# Patient Record
Sex: Female | Born: 1990 | Hispanic: No | Marital: Married
Health system: Southern US, Community
[De-identification: ages and names within clinical notes are randomized; demographics above are authoritative.]

---

## 2015-08-02 DIAGNOSIS — R196 Halitosis: Secondary | ICD-10-CM | POA: Diagnosis not present

## 2015-08-02 DIAGNOSIS — R42 Dizziness and giddiness: Secondary | ICD-10-CM | POA: Diagnosis not present

## 2015-08-02 DIAGNOSIS — R300 Dysuria: Secondary | ICD-10-CM | POA: Diagnosis not present

## 2015-08-02 DIAGNOSIS — R350 Frequency of micturition: Secondary | ICD-10-CM | POA: Diagnosis not present

## 2015-09-28 DIAGNOSIS — R300 Dysuria: Secondary | ICD-10-CM | POA: Diagnosis not present

## 2015-09-28 DIAGNOSIS — Z0000 Encounter for general adult medical examination without abnormal findings: Secondary | ICD-10-CM | POA: Diagnosis not present

## 2016-01-15 DIAGNOSIS — I951 Orthostatic hypotension: Secondary | ICD-10-CM | POA: Diagnosis not present

## 2016-03-15 DIAGNOSIS — R196 Halitosis: Secondary | ICD-10-CM | POA: Diagnosis not present

## 2016-03-15 DIAGNOSIS — B379 Candidiasis, unspecified: Secondary | ICD-10-CM | POA: Diagnosis not present

## 2016-03-15 DIAGNOSIS — R3989 Other symptoms and signs involving the genitourinary system: Secondary | ICD-10-CM | POA: Diagnosis not present

## 2016-10-16 DIAGNOSIS — Z0000 Encounter for general adult medical examination without abnormal findings: Secondary | ICD-10-CM | POA: Diagnosis not present

## 2016-10-16 DIAGNOSIS — Z13220 Encounter for screening for lipoid disorders: Secondary | ICD-10-CM | POA: Diagnosis not present

## 2016-10-16 DIAGNOSIS — I959 Hypotension, unspecified: Secondary | ICD-10-CM | POA: Diagnosis not present

## 2016-10-16 DIAGNOSIS — R196 Halitosis: Secondary | ICD-10-CM | POA: Diagnosis not present

## 2016-10-16 DIAGNOSIS — H9201 Otalgia, right ear: Secondary | ICD-10-CM | POA: Diagnosis not present

## 2016-10-16 DIAGNOSIS — R300 Dysuria: Secondary | ICD-10-CM | POA: Diagnosis not present

## 2016-10-30 DIAGNOSIS — R196 Halitosis: Secondary | ICD-10-CM | POA: Diagnosis not present

## 2016-11-14 DIAGNOSIS — K080 Exfoliation of teeth due to systemic causes: Secondary | ICD-10-CM | POA: Diagnosis not present

## 2016-11-28 DIAGNOSIS — R196 Halitosis: Secondary | ICD-10-CM | POA: Diagnosis not present

## 2017-02-26 DIAGNOSIS — N762 Acute vulvitis: Secondary | ICD-10-CM | POA: Diagnosis not present

## 2017-02-26 DIAGNOSIS — Z681 Body mass index (BMI) 19 or less, adult: Secondary | ICD-10-CM | POA: Diagnosis not present

## 2017-03-19 DIAGNOSIS — B379 Candidiasis, unspecified: Secondary | ICD-10-CM | POA: Diagnosis not present

## 2017-03-19 DIAGNOSIS — R350 Frequency of micturition: Secondary | ICD-10-CM | POA: Diagnosis not present

## 2017-03-19 DIAGNOSIS — M791 Myalgia, unspecified site: Secondary | ICD-10-CM | POA: Diagnosis not present

## 2017-03-19 DIAGNOSIS — M25519 Pain in unspecified shoulder: Secondary | ICD-10-CM | POA: Diagnosis not present

## 2017-04-03 DIAGNOSIS — M79609 Pain in unspecified limb: Secondary | ICD-10-CM | POA: Diagnosis not present

## 2017-07-21 DIAGNOSIS — K080 Exfoliation of teeth due to systemic causes: Secondary | ICD-10-CM | POA: Diagnosis not present

## 2017-08-18 DIAGNOSIS — H6980 Other specified disorders of Eustachian tube, unspecified ear: Secondary | ICD-10-CM | POA: Diagnosis not present

## 2017-08-18 DIAGNOSIS — Z1159 Encounter for screening for other viral diseases: Secondary | ICD-10-CM | POA: Diagnosis not present

## 2017-08-18 DIAGNOSIS — Z975 Presence of (intrauterine) contraceptive device: Secondary | ICD-10-CM | POA: Diagnosis not present

## 2017-08-21 DIAGNOSIS — H6993 Unspecified Eustachian tube disorder, bilateral: Secondary | ICD-10-CM | POA: Diagnosis not present

## 2017-09-25 DIAGNOSIS — K080 Exfoliation of teeth due to systemic causes: Secondary | ICD-10-CM | POA: Diagnosis not present

## 2017-10-13 DIAGNOSIS — B349 Viral infection, unspecified: Secondary | ICD-10-CM | POA: Diagnosis not present

## 2017-11-21 DIAGNOSIS — Z13220 Encounter for screening for lipoid disorders: Secondary | ICD-10-CM | POA: Diagnosis not present

## 2017-11-21 DIAGNOSIS — G479 Sleep disorder, unspecified: Secondary | ICD-10-CM | POA: Diagnosis not present

## 2017-11-21 DIAGNOSIS — Z0000 Encounter for general adult medical examination without abnormal findings: Secondary | ICD-10-CM | POA: Diagnosis not present

## 2017-11-21 DIAGNOSIS — R05 Cough: Secondary | ICD-10-CM | POA: Diagnosis not present

## 2017-11-21 DIAGNOSIS — I959 Hypotension, unspecified: Secondary | ICD-10-CM | POA: Diagnosis not present

## 2017-11-21 DIAGNOSIS — R196 Halitosis: Secondary | ICD-10-CM | POA: Diagnosis not present

## 2018-01-01 DIAGNOSIS — Z01419 Encounter for gynecological examination (general) (routine) without abnormal findings: Secondary | ICD-10-CM | POA: Diagnosis not present

## 2018-01-01 DIAGNOSIS — Z681 Body mass index (BMI) 19 or less, adult: Secondary | ICD-10-CM | POA: Diagnosis not present

## 2018-01-01 DIAGNOSIS — Z124 Encounter for screening for malignant neoplasm of cervix: Secondary | ICD-10-CM | POA: Diagnosis not present

## 2018-02-09 ENCOUNTER — Ambulatory Visit (INDEPENDENT_AMBULATORY_CARE_PROVIDER_SITE_OTHER): Payer: Federal, State, Local not specified - PPO | Admitting: Mental Health

## 2018-02-09 DIAGNOSIS — F4323 Adjustment disorder with mixed anxiety and depressed mood: Secondary | ICD-10-CM
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
## 2018-02-11 DIAGNOSIS — K080 Exfoliation of teeth due to systemic causes: Secondary | ICD-10-CM | POA: Diagnosis not present

## 2018-03-02 ENCOUNTER — Ambulatory Visit: Payer: Federal, State, Local not specified - PPO | Admitting: Mental Health

## 2018-03-02 DIAGNOSIS — F4323 Adjustment disorder with mixed anxiety and depressed mood: Secondary | ICD-10-CM
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
## 2018-03-09 DIAGNOSIS — G4700 Insomnia, unspecified: Secondary | ICD-10-CM | POA: Diagnosis not present

## 2018-03-09 DIAGNOSIS — B379 Candidiasis, unspecified: Secondary | ICD-10-CM | POA: Diagnosis not present

## 2018-03-09 DIAGNOSIS — M542 Cervicalgia: Secondary | ICD-10-CM | POA: Diagnosis not present

## 2018-03-09 DIAGNOSIS — H9319 Tinnitus, unspecified ear: Secondary | ICD-10-CM | POA: Diagnosis not present

## 2018-03-11 DIAGNOSIS — K080 Exfoliation of teeth due to systemic causes: Secondary | ICD-10-CM | POA: Diagnosis not present

## 2018-03-17 ENCOUNTER — Ambulatory Visit: Payer: Federal, State, Local not specified - PPO | Admitting: Mental Health

## 2018-03-31 ENCOUNTER — Ambulatory Visit: Payer: Federal, State, Local not specified - PPO | Admitting: Mental Health

## 2018-04-06 ENCOUNTER — Ambulatory Visit: Payer: Federal, State, Local not specified - PPO | Admitting: Mental Health

## 2018-04-06 DIAGNOSIS — F4323 Adjustment disorder with mixed anxiety and depressed mood: Secondary | ICD-10-CM

## 2018-04-06 NOTE — Progress Notes (Signed)
Psychotherapy Note  Name: April Tapia Date: 04/13/2018 MRN: [id number] DOB: 06/19/1990 PCP: Patient, No Pcp Per  Time spent:  54 minutes  Treatment:  Individual therapy  Mental Status Exam:   Appearance:   Casual     Behavior:  Appropriate  Motor:  Normal  Speech/Language:   Clear and Coherent  Affect:  Appropriate  Mood:  depressed  Thought process:  normal  Thought content:    WNL  Sensory/Perceptual disturbances:    WNL  Orientation:  oriented to person, place and time/date  Attention:  Good  Concentration:  Good  Memory:  WNL  Fund of knowledge:   Good  Insight:    Good  Judgment:   Good  Impulse Control:  Good   Reported Symptoms:  Sleep disturbance, appetite disturbance, depressed mood at times, anxiety at times  Risk Assessment: Danger to Self:  No Self-injurious Behavior: No Danger to Others: No Duty to Warn:no Physical Aggression / Violence:No  Access to Firearms a concern: No  Gang Involvement:No  Patient / guardian was educated about steps to take if suicide or homicide risk level increases between visits: yes While future psychiatric events cannot be accurately predicted, the patient does not currently require acute inpatient psychiatric care and does not currently meet [facility] involuntary commitment criteria.   Subjective: Patient presented for today's session on time, casually dressed in no distress.  Pt stated she has two jobs now getting about 4 hours sleep due to her work schedule. She stated she is not tired. We explored self-care, eating sufficiently, getting more rest and attending doctor appts as needed.  In further discussion, she identified drinking higher amounts of coffee to get through the shifts.  She also identified that she is working these excessive hours largely in part to keep her mind busy, to have less anxiety and depression.  We discussed other coping mechanisms such as diaphragmatic breathing mindfulness and cognitive-based  approaches.  Encouraged her to continue to self monitor negative self talk.   Interventions:  CBT, supportive therapy, strength based / solution focused approaches   Diagnoses:    ICD-10-CM   1. Adjustment disorder with mixed anxiety and depressed mood F43.23      1.  Patient to continue to engage in individual counseling 2-4 times a month or as needed. 2.  Patient to identify and apply CBT, coping skills learned in session to decrease depression and anxiety symptoms. 3.  Patient to contact this office, go to the local ED or call 911 if a crisis or emergency develops between visits.    Waldron Sessionhristopher Simon [place], [facility]

## 2018-04-20 ENCOUNTER — Ambulatory Visit: Payer: Federal, State, Local not specified - PPO | Admitting: Mental Health

## 2018-04-20 DIAGNOSIS — F4323 Adjustment disorder with mixed anxiety and depressed mood: Secondary | ICD-10-CM | POA: Diagnosis not present

## 2018-04-20 NOTE — Progress Notes (Signed)
Psychotherapy Note  Name: April Tapia Date: 04/20/2018 MRN: [id number] DOB: 1990-09-20 PCP: Patient, No Pcp Per  Time spent:  54 minutes  Treatment:  Individual therapy  Mental Status Exam:   Appearance:   Casual     Behavior:  Appropriate  Motor:  Normal  Speech/Language:   Clear and Coherent  Affect:  Appropriate  Mood:  depressed  Thought process:  normal  Thought content:    WNL  Sensory/Perceptual disturbances:    WNL  Orientation:  oriented to person, place and time/date  Attention:  Good  Concentration:  Good  Memory:  WNL  Fund of knowledge:   Good  Insight:    Good  Judgment:   Good  Impulse Control:  Good   Reported Symptoms:  Sleep disturbance, appetite disturbance, depressed mood at times, anxiety at times  Risk Assessment: Danger to Self:  No Self-injurious Behavior: No Danger to Others: No Duty to Warn:no Physical Aggression / Violence:No  Access to Firearms a concern: No  Gang Involvement:No  Patient / guardian was educated about steps to take if suicide or homicide risk level increases between visits: yes While future psychiatric events cannot be accurately predicted, the patient does not currently require acute inpatient psychiatric care and does not currently meet [facility] involuntary commitment criteria.   Subjective: Patient presented for today's session on time, casually dressed in no distress.  Patient shared recent experiences, namely her meeting with her husband at a restaurant to obtain her identification paperwork in which she needs for maintaining her status living in the [place].  She shared other stressors, some disappointing experiences with others at work at times.  She stated she has a tendency to trust others and is affected emotionally when others act negatively towards her, giving examples.  Ways to communicate and set boundaries as needed was discussed.  She stated that she continues to work 2 jobs but often has been tired,  finding sleep between the shifts.  We discussed the possibility in the coming weeks or months to shift her work hours if possible.  She stated she is not in need of extra money for the second job, however she is "fine" to work.  We reviewed the importance of self-care, getting rest and eating sufficiently. We reviewed other coping mechanisms such as diaphragmatic breathing mindfulness and cognitive-based approaches.  Encouraged her to continue to self monitor negative self talk.   Interventions:  CBT, supportive therapy, strength based / solution focused approaches   Diagnoses:    ICD-10-CM   1. Adjustment disorder with mixed anxiety and depressed mood F43.23      1.  Patient to continue to engage in individual counseling 2-4 times a month or as needed. 2.  Patient to identify and apply CBT, coping skills learned in session to decrease depression and anxiety symptoms. 3.  Patient to contact this office, go to the local ED or call 911 if a crisis or emergency develops between visits.    Waldron Session, [facility]

## 2018-05-01 DIAGNOSIS — Z30431 Encounter for routine checking of intrauterine contraceptive device: Secondary | ICD-10-CM | POA: Diagnosis not present

## 2018-05-01 DIAGNOSIS — B379 Candidiasis, unspecified: Secondary | ICD-10-CM | POA: Diagnosis not present

## 2018-05-01 DIAGNOSIS — I959 Hypotension, unspecified: Secondary | ICD-10-CM | POA: Diagnosis not present

## 2018-05-13 ENCOUNTER — Ambulatory Visit (INDEPENDENT_AMBULATORY_CARE_PROVIDER_SITE_OTHER): Payer: Federal, State, Local not specified - PPO | Admitting: Mental Health

## 2018-05-13 DIAGNOSIS — F4323 Adjustment disorder with mixed anxiety and depressed mood: Secondary | ICD-10-CM | POA: Diagnosis not present

## 2018-05-13 NOTE — Progress Notes (Signed)
Psychotherapy Note  Name: Janett BillowManpreet Matheny Date: 05/13/2018 MRN: [id number] DOB: 08-07-1990 PCP: Patient, No Pcp Per  Time spent:  54 minutes  Treatment:  Individual therapy  Mental Status Exam:   Appearance:   Casual     Behavior:  Appropriate  Motor:  Normal  Speech/Language:   Clear and Coherent  Affect:  Appropriate  Mood:  depressed  Thought process:  normal  Thought content:    WNL  Sensory/Perceptual disturbances:    WNL  Orientation:  oriented to person, place and time/date  Attention:  Good  Concentration:  Good  Memory:  WNL  Fund of knowledge:   Good  Insight:    Good  Judgment:   Good  Impulse Control:  Good   Reported Symptoms:  Sleep disturbance, appetite disturbance, depressed mood at times, anxiety at times  Risk Assessment: Danger to Self:  No Self-injurious Behavior: No Danger to Others: No Duty to Warn:no Physical Aggression / Violence:No  Access to Firearms a concern: No  Gang Involvement:No  Patient / guardian was educated about steps to take if suicide or homicide risk level increases between visits: yes While future psychiatric events cannot be accurately predicted, the patient does not currently require acute inpatient psychiatric care and does not currently meet [facility] involuntary commitment criteria.   Subjective: Patient presented for today's session on time, casually dressed in no distress.  Patient shared progress.  She shared how she continues to gain her independence, works full-time and has moved into her own apartment.  She was living in a multi person home renting a room for the last few months.  She shared how she has had some stressful work experiences with some fellow employees and how she has attempted to set boundaries as needed.  Patient presented as visibly tired, fatigued.  We reviewed her sleep schedule and getting adequate rest.  She stated that she plans to seek a new full-time job in the next month which will give her a  more consistent schedule and improve her sleep hygiene.  Encouraged her to follow through as obtaining enough sleep is a component of self-care.  Other ways to cope and care for herself were explored.  Provided support, encouragement continuing work with patient from a strength-based approach.  Encouraged her to contact this office between sessions if needed.   Interventions:  CBT, supportive therapy, strength based / solution focused approaches   Diagnoses:    ICD-10-CM   1. Adjustment disorder with mixed anxiety and depressed mood F43.23      1.  Patient to continue to engage in individual counseling 2-4 times a month or as needed. 2.  Patient to identify and apply CBT, coping skills learned in session to decrease depression and anxiety symptoms. 3.  Patient to contact this office, go to the local ED or call 911 if a crisis or emergency develops between visits.    Waldron Sessionhristopher Careem Yasui, [facility]

## 2018-06-01 DIAGNOSIS — S46811A Strain of other muscles, fascia and tendons at shoulder and upper arm level, right arm, initial encounter: Secondary | ICD-10-CM | POA: Diagnosis not present

## 2018-08-03 ENCOUNTER — Other Ambulatory Visit: Payer: Self-pay | Admitting: Family Medicine

## 2018-08-03 ENCOUNTER — Other Ambulatory Visit: Payer: Self-pay

## 2018-08-03 ENCOUNTER — Ambulatory Visit
Admission: RE | Admit: 2018-08-03 | Discharge: 2018-08-03 | Disposition: A | Discharge status: Home / Self Care | Payer: Federal, State, Local not specified - PPO | Source: Ambulatory Visit | Attending: Family Medicine | Admitting: Family Medicine

## 2018-08-03 DIAGNOSIS — M79671 Pain in right foot: Secondary | ICD-10-CM | POA: Diagnosis not present

## 2018-08-03 DIAGNOSIS — M7989 Other specified soft tissue disorders: Secondary | ICD-10-CM

## 2018-08-03 DIAGNOSIS — I959 Hypotension, unspecified: Secondary | ICD-10-CM | POA: Diagnosis not present

## 2018-08-03 DIAGNOSIS — M25571 Pain in right ankle and joints of right foot: Secondary | ICD-10-CM | POA: Diagnosis not present

## 2020-07-13 IMAGING — CR RIGHT FOOT COMPLETE - 3+ VIEW
3 series · 3 of 3 positions shown · non-contrast
Comparison: None.

CLINICAL DATA: Four days right foot pain over navicular region on
the dorsal surface. No injury.

EXAM:
RIGHT FOOT COMPLETE - 3+ VIEW

[x foot ap right]
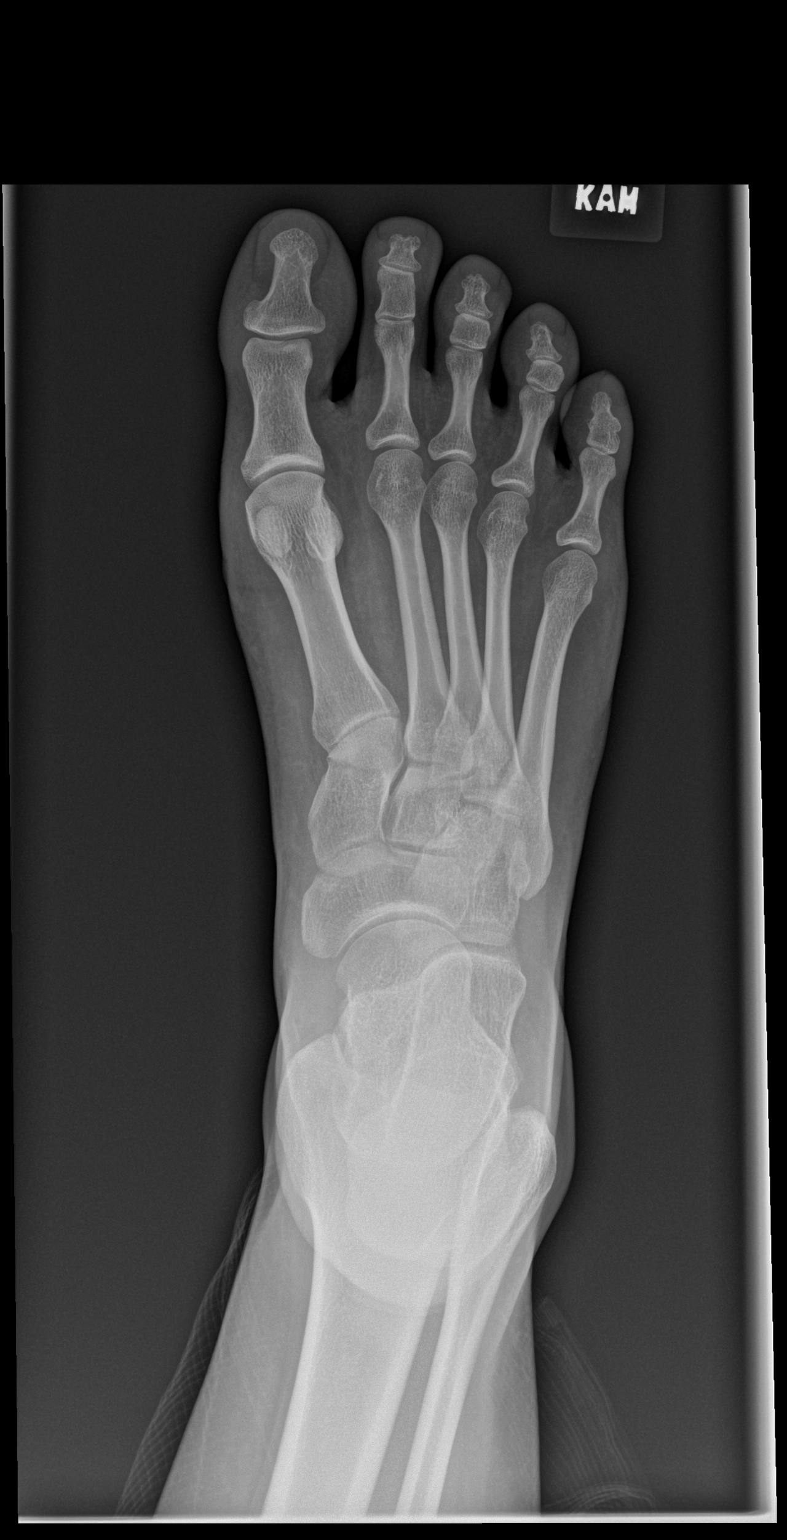

[x foot obl right]
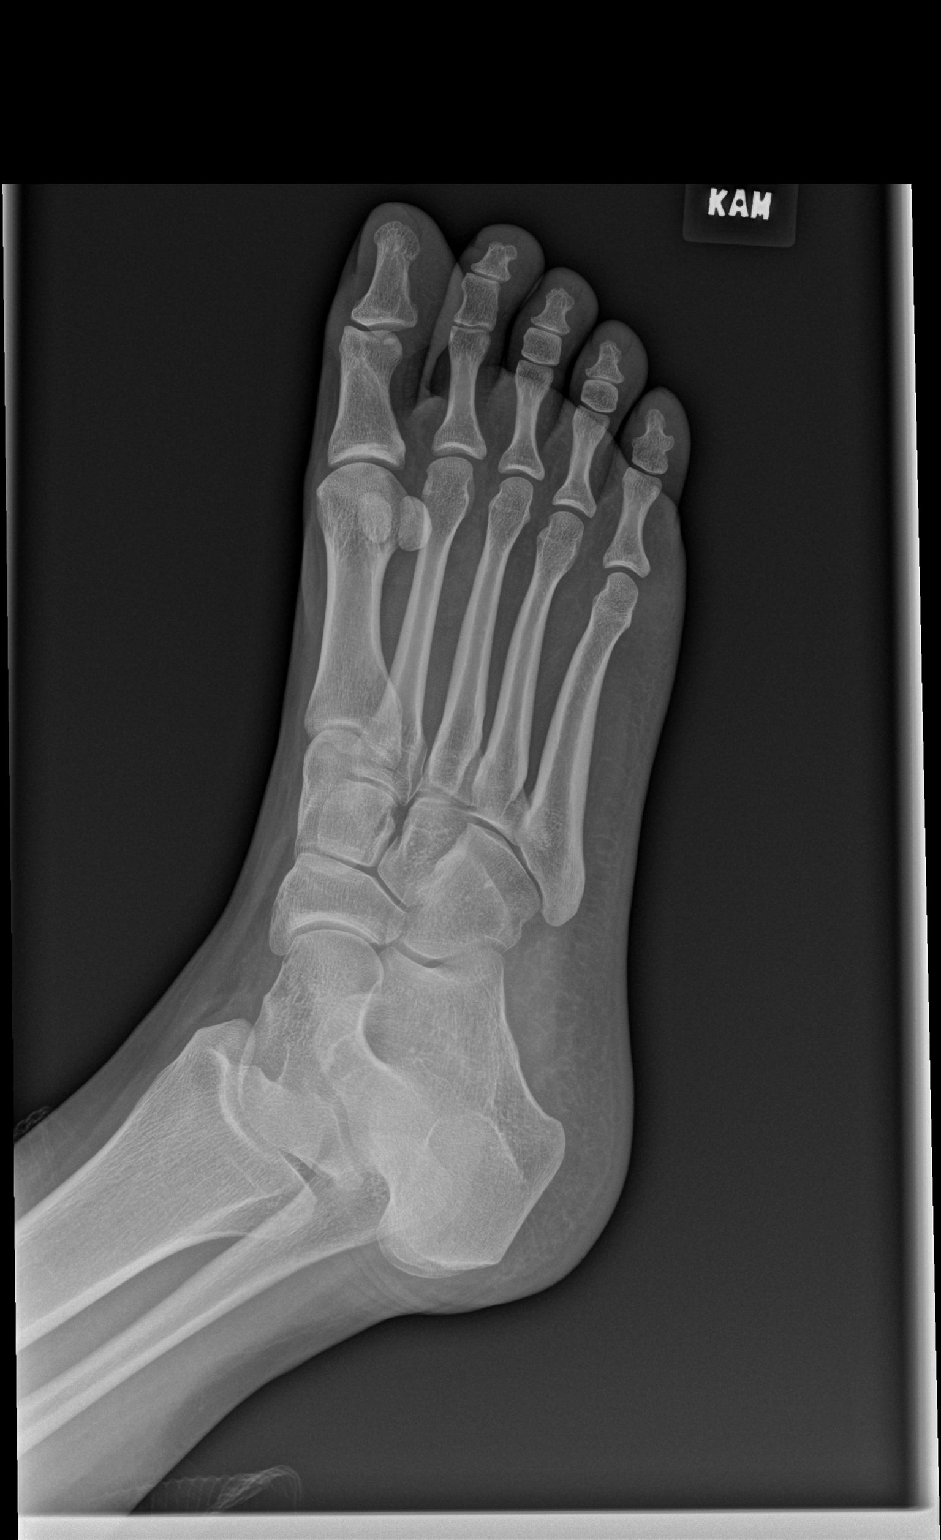

[x foot lat right]
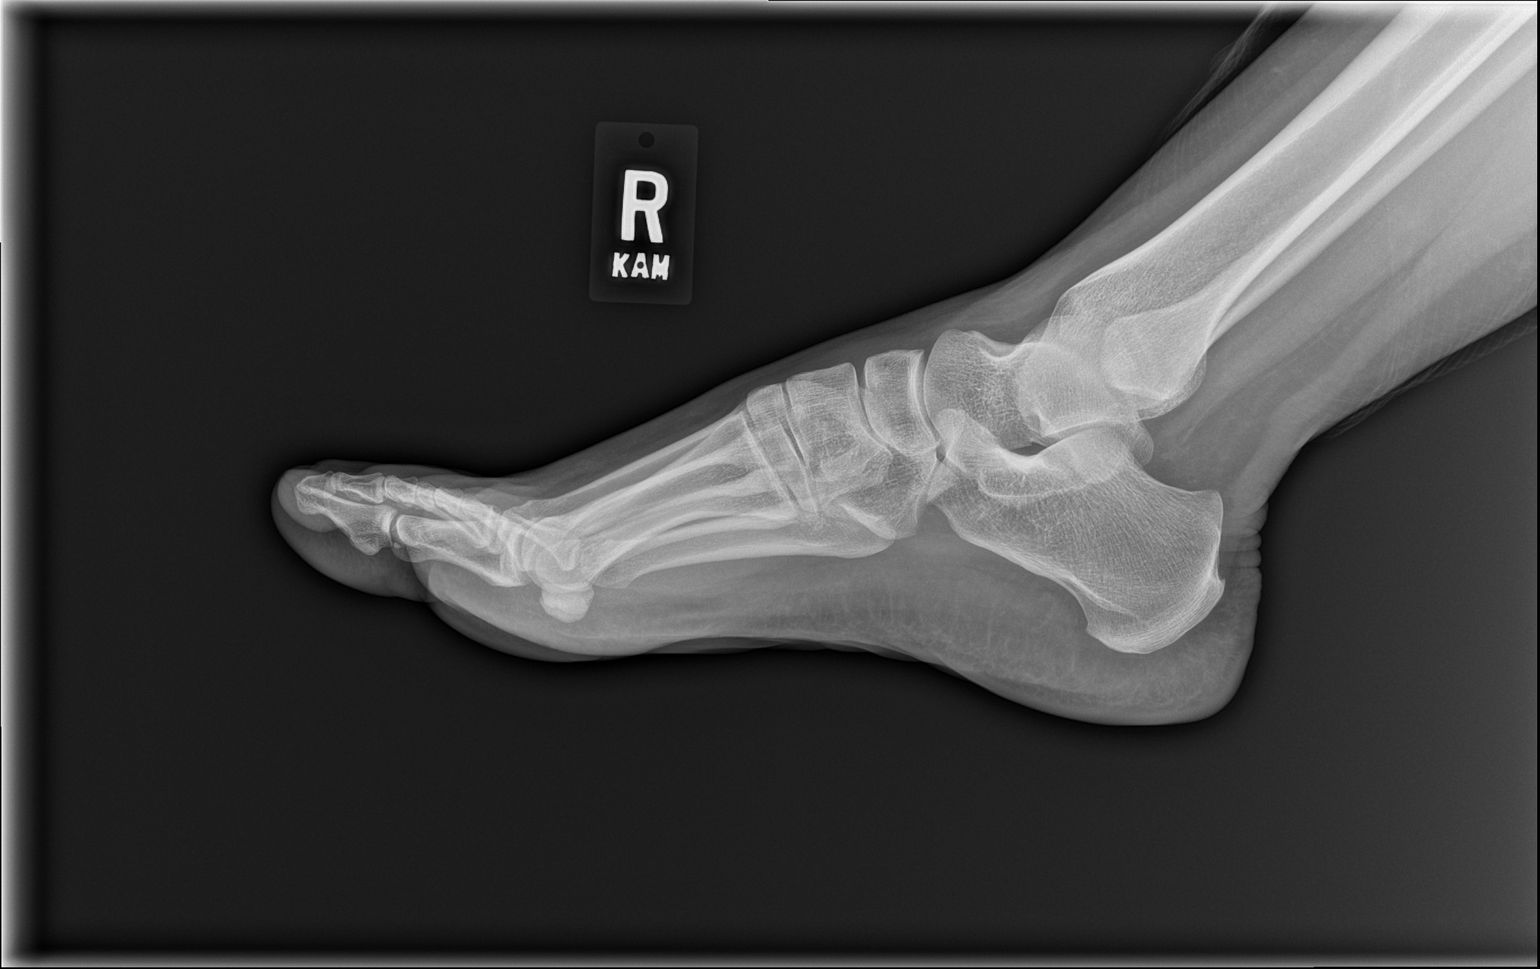

[3 of 3 positions shown; findings below may reference images not displayed]

FINDINGS: There is no evidence of fracture or dislocation. There is no
evidence of arthropathy or other focal bone abnormality. Soft
tissues are unremarkable.
IMPRESSION: Negative.
